# Patient Record
Sex: Female | Born: 1980 | Race: White | Hispanic: No | Marital: Married | Smoking: Current every day smoker
Health system: Southern US, Community
[De-identification: ages and names within clinical notes are randomized; demographics above are authoritative.]

---

## 2000-10-04 HISTORY — PX: TUBAL LIGATION: SHX77

## 2012-05-11 ENCOUNTER — Ambulatory Visit (INDEPENDENT_AMBULATORY_CARE_PROVIDER_SITE_OTHER): Payer: 59 | Admitting: Psychology

## 2012-05-11 DIAGNOSIS — F331 Major depressive disorder, recurrent, moderate: Secondary | ICD-10-CM

## 2012-05-24 ENCOUNTER — Ambulatory Visit (INDEPENDENT_AMBULATORY_CARE_PROVIDER_SITE_OTHER): Payer: 59 | Admitting: Psychology

## 2012-05-24 DIAGNOSIS — F331 Major depressive disorder, recurrent, moderate: Secondary | ICD-10-CM

## 2012-06-07 ENCOUNTER — Ambulatory Visit (INDEPENDENT_AMBULATORY_CARE_PROVIDER_SITE_OTHER): Payer: 59 | Admitting: Psychology

## 2012-06-07 DIAGNOSIS — F331 Major depressive disorder, recurrent, moderate: Secondary | ICD-10-CM

## 2012-06-21 ENCOUNTER — Ambulatory Visit: Payer: 59 | Admitting: Psychology

## 2015-04-17 ENCOUNTER — Other Ambulatory Visit (HOSPITAL_COMMUNITY): Payer: Self-pay | Admitting: Orthopaedic Surgery

## 2015-04-17 DIAGNOSIS — M25561 Pain in right knee: Secondary | ICD-10-CM

## 2015-04-28 ENCOUNTER — Ambulatory Visit (HOSPITAL_COMMUNITY)
Admission: RE | Admit: 2015-04-28 | Discharge: 2015-04-28 | Disposition: A | Discharge status: Home / Self Care | Payer: 59 | Source: Ambulatory Visit | Attending: Orthopaedic Surgery | Admitting: Orthopaedic Surgery

## 2015-04-28 DIAGNOSIS — M659 Synovitis and tenosynovitis, unspecified: Secondary | ICD-10-CM | POA: Diagnosis not present

## 2015-04-28 DIAGNOSIS — M25561 Pain in right knee: Secondary | ICD-10-CM | POA: Diagnosis present

## 2015-04-28 DIAGNOSIS — M25461 Effusion, right knee: Secondary | ICD-10-CM | POA: Diagnosis not present

## 2016-12-16 IMAGING — MR MR KNEE*R* W/O CM
4 of 6 series · 19 of 40 positions shown · non-contrast
Comparison: None.

CLINICAL DATA: Right knee effusions. Right knee pain. Injury 6
months ago.

EXAM:
MRI OF THE RIGHT KNEE WITHOUT CONTRAST
TECHNIQUE: Multiplanar, multisequence MR imaging of the knee was performed. No
intravenous contrast was administered.

[Series 4: PD fat-sat · axial · 4.0mm · 0.27mm/px · z∈[-12,+103]mm · 7 of 24 slices shown (1 of 4)]
[im 1/24]
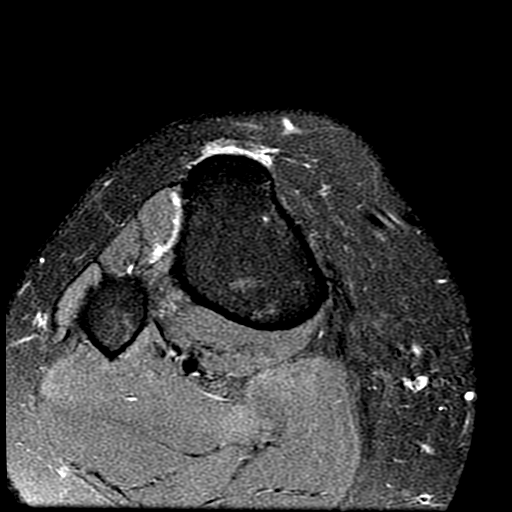
[im 4/24]
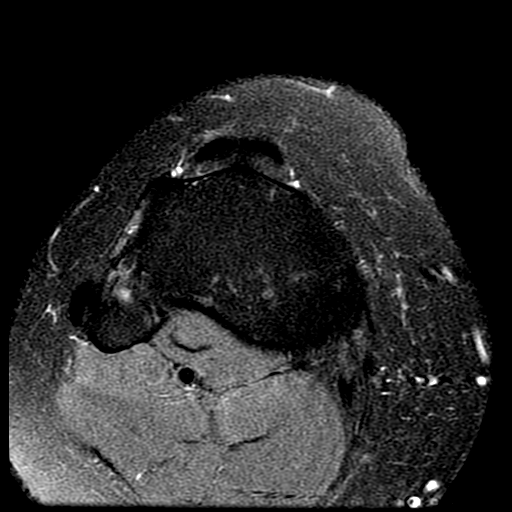
[im 8/24]
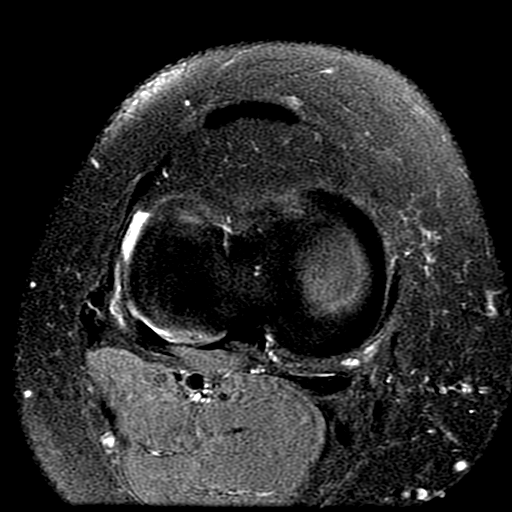
[im 12/24]
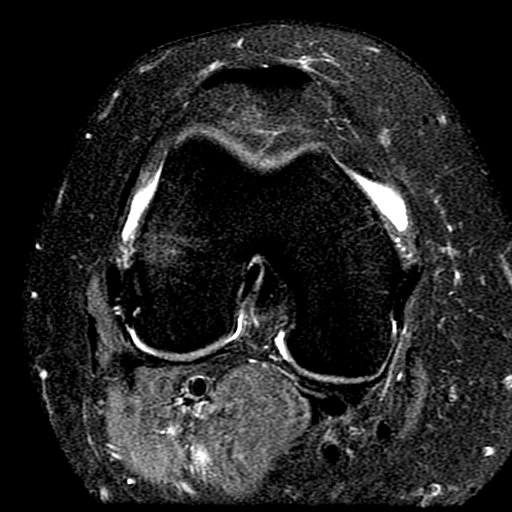
[im 16/24]
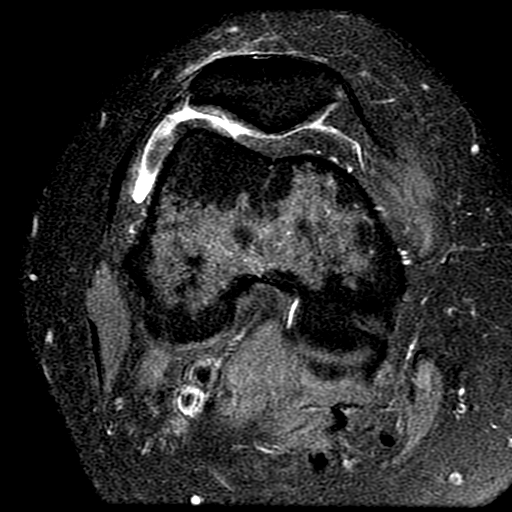
[im 20/24]
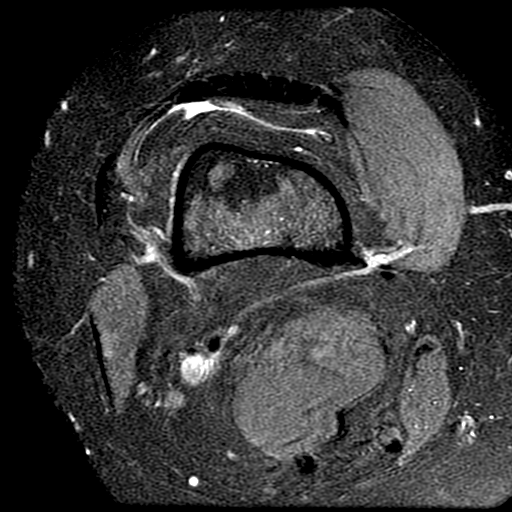
[im 24/24]
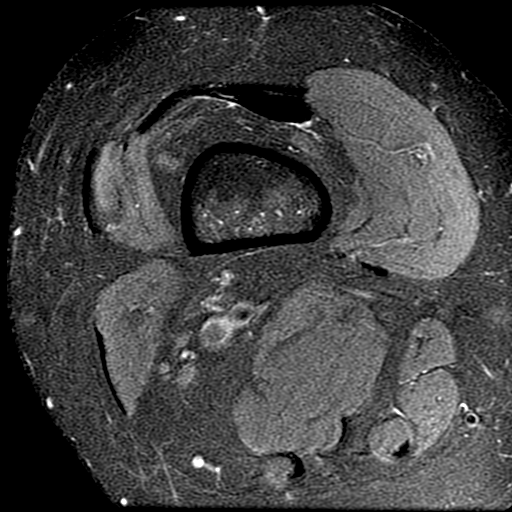

[Series 6: PD fat-sat · coronal · 4.0mm · 0.31mm/px · 6 of 24 slices shown (2 of 4)]
[im 1/24]
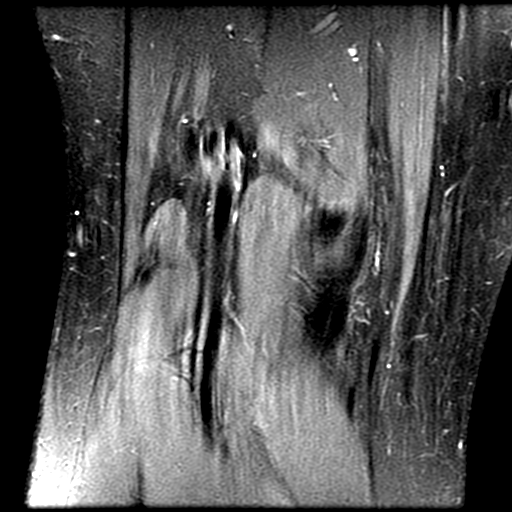
[im 4/24]
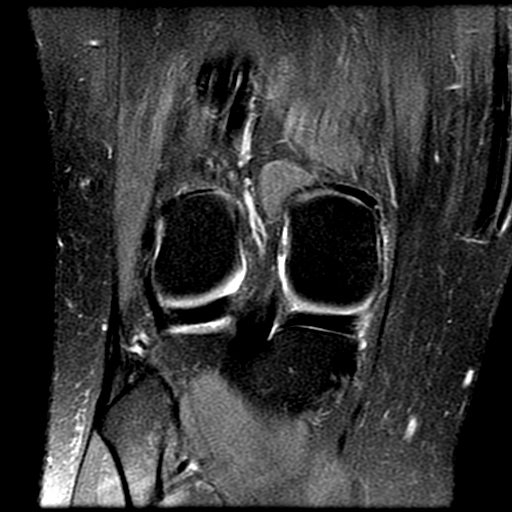
[im 8/24]
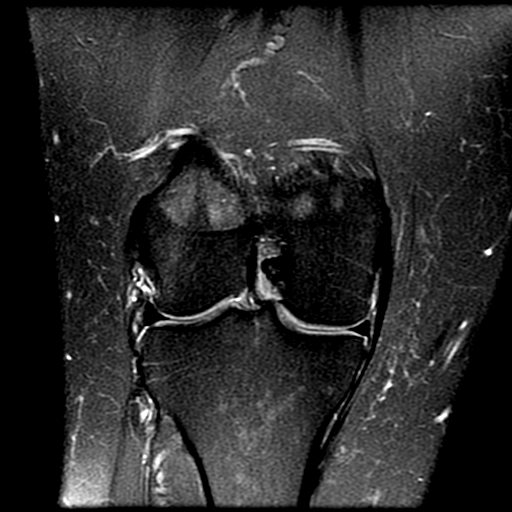
[im 12/24]
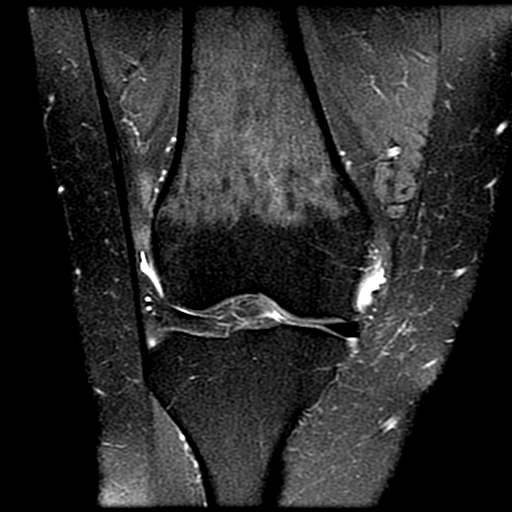
[im 16/24]
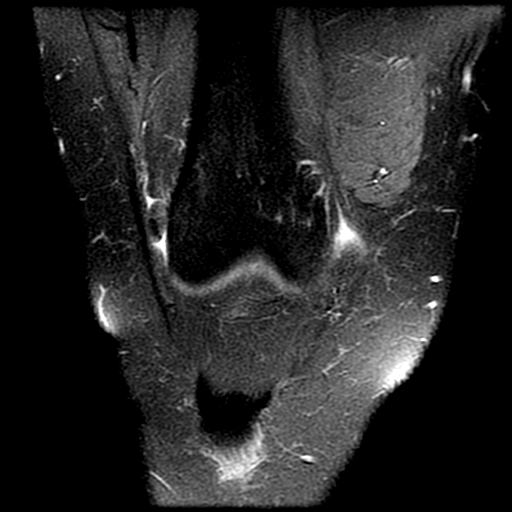
[im 20/24]
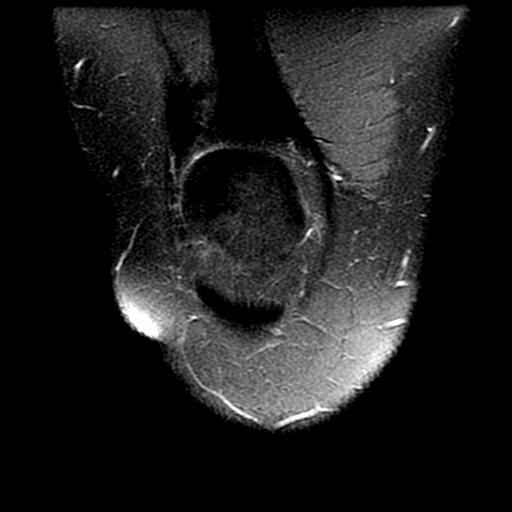

[Series 7: PD fat-sat · sagittal · 4.0mm · 0.31mm/px · 3 of 24 slices shown (3 of 4)]
[im 4/24]
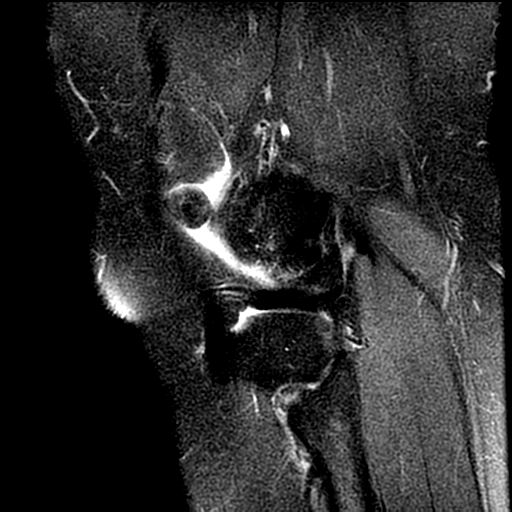
[im 12/24]
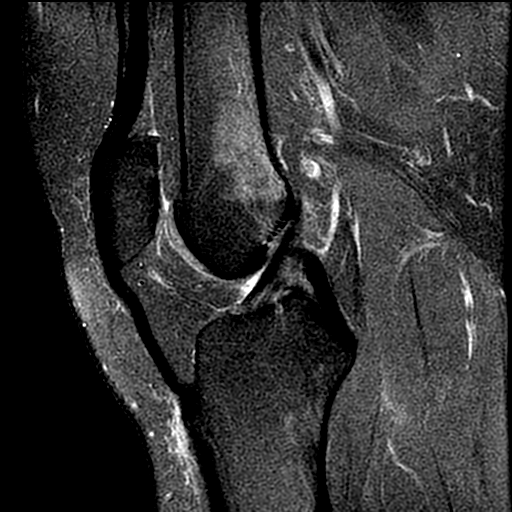
[im 20/24]
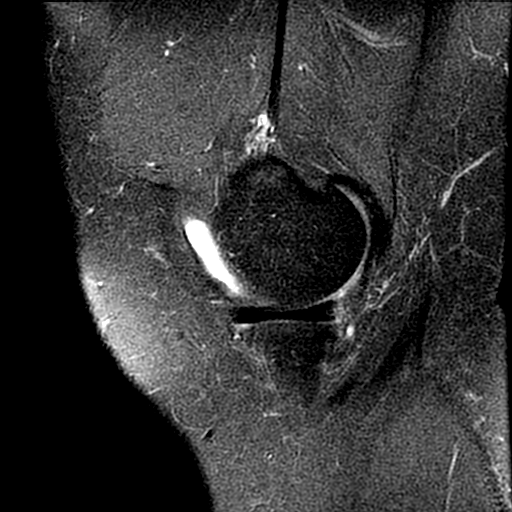

[Series 9: PD fat-sat · coronal · 2.0mm · 0.31mm/px · 3 of 16 slices shown (4 of 4)]
[im 1/16]
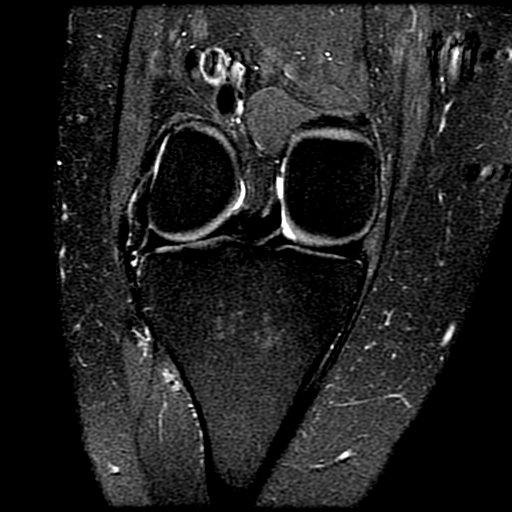
[im 8/16]
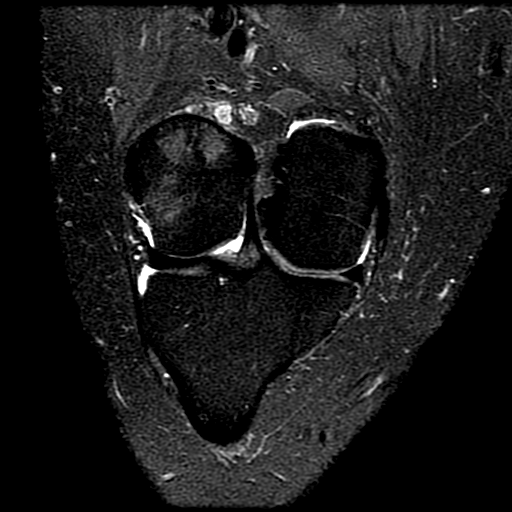
[im 16/16]
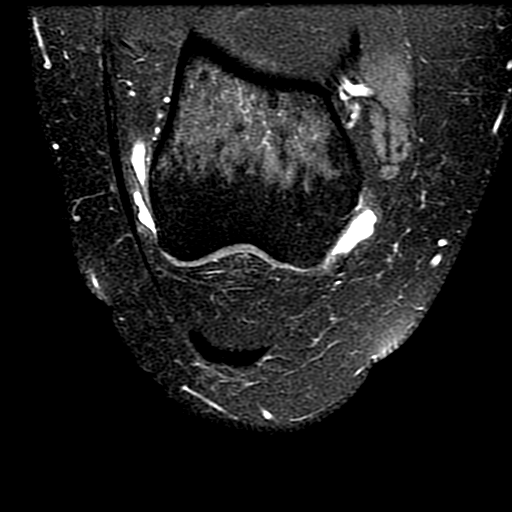

[19 of 40 positions shown; findings below may reference images not displayed]

FINDINGS: MENISCI

Medial meniscus:  Unremarkable

Lateral meniscus:  Unremarkable

LIGAMENTS

Cruciates:  Unremarkable

Collaterals:  Unremarkable

CARTILAGE

Patellofemoral: Moderate to severe degenerative chondral thinning
along portions of the posterior patellar ridge and adjacent medial
and lateral facets. Mild chondral fissuring along the lateral
patellar facet. Moderate chondral thinning in the femoral trochlear
groove. Mild marginal spurring.

Medial: Mild to moderate degenerative chondral thinning. Mild
marginal spurring.

Lateral: Mild degenerative chondral thinning. Mild marginal
spurring.

Joint: Trace knee effusion with mild synovitis lateral to the
patellofemoral joint as shown on images 14 through 17 of series 8.

Popliteal Fossa:  Unremarkable

Extensor Mechanism:  Unremarkable

Bones: Minimal marrow edema laterally in the lateral femoral condyle
(image 9, series 8).
IMPRESSION: 1. Trace knee effusion with low-level synovitis below the lateral
patellar retinaculum.
2. Chondral thinning most striking in the patellofemoral joint.
3. The minimal marrow edema laterally in the lateral femoral
condyle. This may be reactive from the adjacent synovitis.
4. Overall I am doubtful of iliotibial band syndrome although some
of the findings in this case could also be seen in iliotibial band
syndrome.

## 2018-01-30 ENCOUNTER — Ambulatory Visit (INDEPENDENT_AMBULATORY_CARE_PROVIDER_SITE_OTHER): Payer: 59

## 2018-01-30 ENCOUNTER — Ambulatory Visit (INDEPENDENT_AMBULATORY_CARE_PROVIDER_SITE_OTHER): Payer: 59 | Admitting: Podiatry

## 2018-01-30 DIAGNOSIS — M2141 Flat foot [pes planus] (acquired), right foot: Secondary | ICD-10-CM

## 2018-01-30 DIAGNOSIS — M779 Enthesopathy, unspecified: Secondary | ICD-10-CM

## 2018-01-30 DIAGNOSIS — M7750 Other enthesopathy of unspecified foot: Secondary | ICD-10-CM

## 2018-01-30 DIAGNOSIS — M7751 Other enthesopathy of right foot: Secondary | ICD-10-CM | POA: Diagnosis not present

## 2018-01-30 DIAGNOSIS — M2142 Flat foot [pes planus] (acquired), left foot: Secondary | ICD-10-CM | POA: Diagnosis not present

## 2018-01-30 DIAGNOSIS — M778 Other enthesopathies, not elsewhere classified: Secondary | ICD-10-CM

## 2018-01-30 NOTE — Progress Notes (Signed)
  Subjective:  Patient ID: Alexandra Ortega, female    DOB: 04/19/1981,  MRN: [id number]  Chief Complaint  Patient presents with  . Numbness    R 2nd-3rd toes numbness and tingling x 1 week -not diabetic Tx: none  . Flat Foot    -interested new set of orthotics Pt. stated," I used to wear orthotics when I was a little kid." Tx: good supportive shoes    37 y.o. female presents with the above complaint.  Complains of right second third toe numbness and tingling x1 week.  States that it started after she had a new pair of shoes that she believes from the top of her foot.  Reports pain in this area as well.  Reports a history of flatfeet and wishes to discuss orthotics.  She states she is to wear orthotics and she was a kid.   No past medical history on file. No current outpatient medications on file.  Not on File Review of Systems: Negative except as noted in the HPI. Denies N/V/F/Ch. Objective:  There were no vitals filed for this visit. General AA&O x3. Normal mood and affect.  Vascular Dorsalis pedis and posterior tibial pulses  present 2+ bilaterally  Capillary refill normal to all digits. Pedal hair growth normal.  Neurologic Epicritic sensation grossly present.  Dermatologic No open lesions. Interspaces clear of maceration. Nails well groomed and normal in appearance.  Orthopedic: MMT 5/5 in dorsiflexion, plantarflexion, inversion, and eversion. Normal joint ROM without pain or crepitus. Pain palpation about the dorsal midfoot right.   Assessment & Plan:  Patient was evaluated and treated and all questions answered.  Capsulitis/Tendonitis R -X-rays taken reviewed pes planus deformity.  No acute fractures or dislocations. -Discussed patient at the numbness and tingling in the toes is most likely from irritation over the dorsal midfoot.  Injection delivered to the dorsal midfoot.  Procedure: Joint Injection Location: Right dorsal TMT joint Skin Prep: Alcohol. Injectate: 0.5 cc  1% lidocaine plain, 0.5 cc dexamethasone phosphate. Disposition: Patient tolerated procedure well. Injection site dressed with a band-aid.   Pes Planus -We will fabricate custom orthotics for patient. Patient understands they are not covered by her insurance.  Return in about 1 month (around 02/27/2018) for Capsulitis, Tendonitis.

## 2018-01-31 ENCOUNTER — Other Ambulatory Visit: Payer: Self-pay | Admitting: Podiatry

## 2018-01-31 DIAGNOSIS — M2141 Flat foot [pes planus] (acquired), right foot: Secondary | ICD-10-CM

## 2018-01-31 DIAGNOSIS — M779 Enthesopathy, unspecified: Secondary | ICD-10-CM

## 2018-01-31 DIAGNOSIS — M778 Other enthesopathies, not elsewhere classified: Secondary | ICD-10-CM

## 2018-01-31 DIAGNOSIS — M2142 Flat foot [pes planus] (acquired), left foot: Principal | ICD-10-CM

## 2018-03-23 ENCOUNTER — Ambulatory Visit: Payer: 59 | Admitting: *Deleted

## 2018-03-23 DIAGNOSIS — M2141 Flat foot [pes planus] (acquired), right foot: Secondary | ICD-10-CM

## 2018-03-23 DIAGNOSIS — M778 Other enthesopathies, not elsewhere classified: Secondary | ICD-10-CM

## 2018-03-23 DIAGNOSIS — M2142 Flat foot [pes planus] (acquired), left foot: Principal | ICD-10-CM

## 2018-03-23 DIAGNOSIS — M779 Enthesopathy, unspecified: Secondary | ICD-10-CM

## 2018-03-23 NOTE — Patient Instructions (Signed)

## 2018-03-23 NOTE — Progress Notes (Signed)
Patient ID: Alexandra Ortega, female   DOB: 10/07/1980, 37 y.o.   MRN: [id number]   Patient presents for orthotic pick up.  Verbal and written break in and wear instructions given.  Patient will follow up with Dr. Samuella CotaPrice in 4-6 weeks if symptoms worsen or fail to improve.

## 2020-10-04 HISTORY — PX: HERNIA REPAIR: SHX51

## 2020-10-04 HISTORY — PX: OTHER SURGICAL HISTORY: SHX169

## 2022-12-06 ENCOUNTER — Encounter: Payer: Self-pay | Admitting: Internal Medicine

## 2022-12-06 ENCOUNTER — Ambulatory Visit: Payer: BC Managed Care – PPO | Admitting: Internal Medicine

## 2022-12-06 VITALS — BP 136/78 | HR 106 | Temp 97.9°F | Resp 17 | Ht 66.5 in | Wt 226.2 lb

## 2022-12-06 DIAGNOSIS — Z889 Allergy status to unspecified drugs, medicaments and biological substances status: Secondary | ICD-10-CM

## 2022-12-06 DIAGNOSIS — J3089 Other allergic rhinitis: Secondary | ICD-10-CM | POA: Diagnosis not present

## 2022-12-06 DIAGNOSIS — J302 Other seasonal allergic rhinitis: Secondary | ICD-10-CM

## 2022-12-06 DIAGNOSIS — J310 Chronic rhinitis: Secondary | ICD-10-CM

## 2022-12-06 MED ORDER — METHYLPREDNISOLONE ACETATE 40 MG/ML IJ SUSP
40.0000 mg | Freq: Once | INTRAMUSCULAR | Status: AC
  Administered 2022-12-06: 40 mg via INTRAMUSCULAR

## 2022-12-06 MED ORDER — LEVOCETIRIZINE DIHYDROCHLORIDE 5 MG PO TABS: 5.0000 mg | ORAL_TABLET | Freq: Every evening | ORAL | 5 refills | Status: DC

## 2022-12-06 MED ORDER — AZELASTINE HCL 0.1 % NA SOLN: 2.0000 | Freq: Two times a day (BID) | NASAL | 12 refills | Status: DC

## 2022-12-06 MED ORDER — EPINEPHRINE 0.3 MG/0.3ML IJ SOAJ: 0.3000 mg | INTRAMUSCULAR | 1 refills | Status: AC | PRN

## 2022-12-06 MED ORDER — MONTELUKAST SODIUM 10 MG PO TABS: 10.0000 mg | ORAL_TABLET | Freq: Every day | ORAL | 5 refills | Status: DC

## 2022-12-06 NOTE — Progress Notes (Unsigned)
New Patient Note  RE: Alexandra Ortega MRN: [id number] DOB: 1981-03-22 Date of Office Visit: 12/06/2022  Consult requested by: Haydee Salter, NP Primary care provider: Haydee Salter, NP  Chief Complaint: No chief complaint on file.  History of Present Illness: I had the pleasure of seeing Alexandra Ortega for initial evaluation at the [facility] on 12/06/2022. She is a 42 y.o. female, who is referred here by Haydee Salter, NP for the evaluation of animal allergy.  History obtained from patient, chart review.  Chronic rhinitis: started OCT 2023, worsened since getting 3rd female dog in [facility] 2024.  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 She has been referred to ENT  Nonallergic triggers:  denies      Assessment and Plan: Ilima is a 42 y.o. female with: No diagnosis found.  *** Plan: Patient Instructions  Chronic Rhinitis {Blank single:19197::"Seasonal and Perennial Allergic","Seasonal Allergic","Perennial Allergic","Nonallergic"}: - allergy testing today: ***  - Prevention:  - allergen avoidance when possible - consider allergy shots as long term control of your symptoms by teaching your immune system to be more tolerant of your allergy triggers  - Symptom control: - {Blank single:19197::"Continue","Start","Increase","Consider"} Nasal Steroid Spray: Best results if used daily. -  Options include Flonase (fluticasone), Nasocort (triamcinolone), Nasonex (mometasome) 1- 2 sprays in each nostril daily.  - All can be purchased over-the-counter if not covered by insurance. - {Blank single:19197::"Continue","Start","Increase","Consider"} {Blank single:19197::"Astelin (azelastine)","Ryaltris"} 1-2 sprays in each nostril twice a day as needed for nasal congestion/itchy nose - {Blank single:19197::"Continue","Start","Increase","Consider"} Atrovent (Ipratropium Bromide) 1-2 sprays in each nostril up to 3 times a day as needed for runny nose/post nasal drip/drainage.   - Use less frequently if airway gets too dry. - {Blank single:19197::"Continue","Start","Increase","Consider"} Singulair (Montelukast) '10mg'$  nightly.   - Discontinue if nightmares of behavior changes. - {Blank single:19197::"Continue","Start","Increase,","Consider"} Antihistamine: daily or daily as needed.   -Options include Zyrtec (Cetirizine) '10mg'$ , Claritin (Loratadine) '10mg'$ , Allegra (Fexofenadine) '180mg'$ , or Xyzal (Levocetirinze) '5mg'$  - Can be purchased over-the-counter if not covered by insurance.  {Blank single:19197::"Allergic","Nonallergic"} Conjunctivitis:  - {Blank single:19197::"Continue","Start","Increase","Consider"} Allergy Eye drops-great options include Pataday (Olopatadine) or Zaditor (ketotifen) for eye symptoms daily as needed-both sold over the counter if not covered by insurance. and Rewetting Drops such as Systane,TheraTears, etc  -Avoid eye drops that say red eye relief as they may contain medications that dry out your eyes.   Follow up:   Thank you so much for letting me partake in your care today.  Don't hesitate to reach out if you have any additional concerns!  Roney Marion, MD  [facility] [place] [place]  Control of Dog or Cat Allergen  Avoidance is the best way to manage a dog or cat allergy. If you have a dog or cat and are allergic to dog or cats, consider removing the  dog or cat from the home. If you have a dog or cat but don't want to find it a new  home, or if your family wants a pet even though someone in the household is allergic, here are some strategies that may help keep symptoms at bay:  Keep the pet out of your bedroom and restrict it to only a few rooms. Be advised that keeping the dog or cat in only one room will not limit the allergens to that room. Don't pet, hug or kiss the dog or cat; if you do, wash your hands with soap and water. High-efficiency particulate air (HEPA) cleaners run continuously in a bedroom or living room can reduce allergen levels over time. Regular use of a high-efficiency vacuum cleaner or a central vacuum can reduce allergen levels. Giving your dog or cat a bath at least once a week can reduce airborne allergen.  DUST MITE AVOIDANCE MEASURES:  There are three main measures that need and can be taken to avoid house dust mites:  Reduce accumulation of dust in general -reduce furniture, clothing, carpeting, books, stuffed animals, especially in bedroom  Separate yourself from the dust -use pillow and mattress encasements (can be found at stores such as [employer], [employer] or online) -avoid direct exposure to air condition flow -use a HEPA filter device, especially in the bedroom; you can also use a HEPA filter vacuum cleaner -wipe dust with a moist towel instead of a dry towel or broom when cleaning  Decrease mites and/or their secretions -wash clothing and linen and stuffed animals at highest temperature possible, at least every 2 weeks -stuffed animals can also be placed in a bag and put in a freezer overnight  Despite the above measures, it is impossible to eliminate dust mites or their allergen completely from your home.  With the above measures the burden of mites in your home can be diminished, with the goal of minimizing your allergic symptoms.  Success will be reached only when implementing and using all means  together.  Reducing Pollen Exposure  The American Academy of Allergy, Asthma and Immunology suggests the following steps to reduce your exposure to pollen during allergy seasons.    Do not hang sheets or clothing out to dry; pollen may collect on these items. Do not mow lawns or spend time around freshly cut grass; mowing stirs up pollen. Keep windows closed at night.  Keep car windows closed while driving. Minimize morning activities outdoors, a time when pollen counts are usually at their highest. Stay indoors as much as possible when pollen counts or humidity is high and on windy days when pollen tends to remain in the air longer. Use air conditioning when possible.  Many air conditioners have filters that trap the pollen spores. Use a HEPA room air filter to remove pollen form the indoor air you breathe.    No orders of the defined types were placed in this encounter.  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  Epicutaneous Testing: Dust mite, dog, cat  Intradermal Testing: ***  adequate controls  Results interpreted by myself and discussed with patient/family.   Past Medical History: There are no problems to display for this patient.  No past medical history on file. Past Surgical History: ***  The histories are not reviewed yet. Please review them in the "History" navigator section and refresh this [place]. Medication List:  No current outpatient medications on file.   No current facility-administered medications for this visit.   Allergies: Not on File Social  History: Social History   Socioeconomic History   Marital status: Married    Spouse name: Not on file   Number of children: Not on file   Years of education: Not on file   Highest education level: Not on file  Occupational History   Not on file  Tobacco Use   Smoking status: Not on file   Smokeless tobacco: Not on file  Substance and Sexual Activity   Alcohol use: Not on file   Drug use: Not on file   Sexual activity: Not on file  Other Topics Concern   Not on file  Social History Narrative   Not on file   Social Determinants of Health   Financial Resource Strain: Not on file  Food Insecurity: Not on file  Transportation Needs: Not on file  Physical Activity: Not on file  Stress: Not on file  Social Connections: Not on file   Lives in a ***. Smoking: *** Occupation: ***  Environmental HistoryFreight forwarder in the house: Estate agent in the family room: {Blank single:19197::"yes","no"} Carpet in the bedroom: {Blank single:19197::"yes","no"} Heating: {Blank single:19197::"electric","gas","heat pump"} Cooling: {Blank single:19197::"central","window","heat pump"} Pet: {Blank single:19197::"yes ***","no"}  Family History: No family history on file.   ROS: All others negative except as noted per HPI.   Objective: There were no vitals taken for this visit. There is no height or weight on file to calculate BMI.  General Appearance:  Alert, cooperative, no distress, appears stated age  Head:  Normocephalic, without obvious abnormality, atraumatic  Eyes:  Conjunctiva clear, EOM's intact  Nose: Nares normal,  very edematous nasal mucosa with clear rhinorrhea, 90% obstruction on bilateral sides, hypertrophic turbinates, no visible anterior polyps, and septum midline  Throat: Lips, tongue normal; teeth and gums normal, no tonsillar exudate and + cobblestoning  Neck: Supple, symmetrical  Lungs:   clear to auscultation bilaterally,  Respirations unlabored, no coughing  Heart:  regular rate and rhythm and no murmur, Appears well perfused  Extremities: No edema  Skin: Skin color, texture, turgor normal, no rashes or lesions on visualized portions of skin  Neurologic: No gross deficits   The plan was reviewed with the patient/family, and all questions/concerned were addressed.  It was my pleasure to see Conesha today and participate in her care. Please feel free to contact me with any questions or concerns.  Sincerely,  Roney Marion, MD Allergy & Immunology  [facility] office: [phone] [facility] office: [phone]

## 2022-12-06 NOTE — Patient Instructions (Addendum)
Chronic Rhinitis Seasonal and Perennial Allergic: not well controlled  - allergy testing today: Dust mite, dog, cat, [place] grass, johnson grass, ragweed and weed   - Prevention:  - allergen avoidance when possible - Start Allergy injections: information for standard and RUSH build up given today.  Consent signed and epipen prescribed  -Stop Afrin as this is contributing to your symptoms  -40 mg of Depo-Medrol IM provided for rhinitis medic and to assess  - Symptom control: - Start Nasal Steroid Spray: Best results if used daily. - Options include Flonase (fluticasone), Nasocort (triamcinolone), Nasonex (mometasome) 1- 2 sprays in each nostril daily.  - All can be purchased over-the-counter if not covered by insurance. - Start Astelin (azelastine) 1-2 sprays in each nostril twice a day as needed for nasal congestion/itchy nose  - Start Singulair (Montelukast) '10mg'$  nightly.   - Discontinue if nightmares of behavior changes. - Continue Antihistamine: daily or daily as needed.   -Options include Zyrtec (Cetirizine) '10mg'$ , Claritin (Loratadine) '10mg'$ , Allegra (Fexofenadine) '180mg'$ , or Xyzal (Levocetirinze) '5mg'$  - Can be purchased over-the-counter if not covered by insurance.   Follow up: in clinic in 6 months  Follow up: for RUSH AIT procedure   Thank you so much for letting me partake in your care today.  Don't hesitate to reach out if you have any additional concerns!  Roney Marion, MD  [facility] [place], [place]  Control of Dog or Cat Allergen  Avoidance is the best way to manage a dog or cat allergy. If you have a dog or cat and are allergic to dog or cats, consider removing the dog or cat from the home. If you have a dog or cat but don't want to find it a new home, or if your family wants a pet even though someone in the household is allergic, here are some strategies that may help keep symptoms at bay:  Keep the pet out of your bedroom and restrict it to only a  few rooms. Be advised that keeping the dog or cat in only one room will not limit the allergens to that room. Don't pet, hug or kiss the dog or cat; if you do, wash your hands with soap and water. High-efficiency particulate air (HEPA) cleaners run continuously in a bedroom or living room can reduce allergen levels over time. Regular use of a high-efficiency vacuum cleaner or a central vacuum can reduce allergen levels. Giving your dog or cat a bath at least once a week can reduce airborne allergen.  DUST MITE AVOIDANCE MEASURES:  There are three main measures that need and can be taken to avoid house dust mites:  Reduce accumulation of dust in general -reduce furniture, clothing, carpeting, books, stuffed animals, especially in bedroom  Separate yourself from the dust -use pillow and mattress encasements (can be found at stores such as Bed, [employer], [employer] or online) -avoid direct exposure to air condition flow -use a HEPA filter device, especially in the bedroom; you can also use a HEPA filter vacuum cleaner -wipe dust with a moist towel instead of a dry towel or broom when cleaning  Decrease mites and/or their secretions -wash clothing and linen and stuffed animals at highest temperature possible, at least every 2 weeks -stuffed animals can also be placed in a bag and put in a freezer overnight  Despite the above measures, it is impossible to eliminate dust mites or their allergen completely from your home.  With the above measures the burden of  mites in your home can be diminished, with the goal of minimizing your allergic symptoms.  Success will be reached only when implementing and using all means together.  Reducing Pollen Exposure  The American Academy of Allergy, Asthma and Immunology suggests the following steps to reduce your exposure to pollen during allergy seasons.    Do not hang sheets or clothing out to dry; pollen may collect on these items. Do not mow lawns or spend  time around freshly cut grass; mowing stirs up pollen. Keep windows closed at night.  Keep car windows closed while driving. Minimize morning activities outdoors, a time when pollen counts are usually at their highest. Stay indoors as much as possible when pollen counts or humidity is high and on windy days when pollen tends to remain in the air longer. Use air conditioning when possible.  Many air conditioners have filters that trap the pollen spores. Use a HEPA room air filter to remove pollen form the indoor air you breathe.

## 2022-12-07 DIAGNOSIS — J310 Chronic rhinitis: Secondary | ICD-10-CM | POA: Insufficient documentation

## 2022-12-08 DIAGNOSIS — J3089 Other allergic rhinitis: Secondary | ICD-10-CM | POA: Diagnosis not present

## 2022-12-08 NOTE — Progress Notes (Signed)
VIALS EXP 12-08-23

## 2022-12-08 NOTE — Progress Notes (Signed)
Aeroallergen Immunotherapy   Ordering Provider: Dr. Roney Marion   Patient Details  Name: Alexandra Ortega  MRN: [id number]  Date of Birth: [birth year]   Order 1 of 1   Vial Label: [id number]   0.3 ml (Volume)  BAU Concentration -- [place] 10,000  0.2 ml (Volume)  1:20 Concentration -- Johnson  0.3 ml (Volume)  1:20 Concentration -- Ragweed Mix  0.5 ml (Volume)  1:20 Concentration -- Weed Mix*  0.5 ml (Volume)  1:10 Concentration -- Cat Hair  0.5 ml (Volume)  1:10 Concentration -- Dog Epithelia  0.5 ml (Volume)   AU Concentration -- Mite Mix (DF 5,000 & DP 5,000)    2.8  ml Extract Subtotal  2.2  ml Diluent  5.0  ml Maintenance Total   Schedule:  RUSH  Silver Vial (1:1,000,000): RUSH  Blue Vial (1:100,000): RUSH  Yellow Vial (1:10,000): RUSH  Green Vial (1:1,000): Schedule B (6 doses)  Red Vial (1:100): Schedule A (10 doses)   Special Instructions: RUSH

## 2022-12-22 MED ORDER — PREDNISONE 20 MG PO TABS: ORAL_TABLET | ORAL | 0 refills | Status: DC

## 2022-12-22 MED ORDER — FAMOTIDINE 20 MG PO TABS: ORAL_TABLET | ORAL | 0 refills | Status: DC

## 2022-12-24 ENCOUNTER — Other Ambulatory Visit: Payer: Self-pay | Admitting: Internal Medicine

## 2022-12-28 ENCOUNTER — Ambulatory Visit: Payer: BC Managed Care – PPO | Admitting: Internal Medicine

## 2022-12-28 VITALS — BP 126/78 | HR 92 | Temp 98.0°F | Resp 18

## 2022-12-28 DIAGNOSIS — J302 Other seasonal allergic rhinitis: Secondary | ICD-10-CM | POA: Diagnosis not present

## 2022-12-28 DIAGNOSIS — J3089 Other allergic rhinitis: Secondary | ICD-10-CM

## 2022-12-28 DIAGNOSIS — J309 Allergic rhinitis, unspecified: Secondary | ICD-10-CM

## 2022-12-31 NOTE — Progress Notes (Signed)
RAPID DESENSITIZATION Note  RE: Alexandra Ortega MRN: [id number] DOB: August 16, 1981 Date of Office Visit: 12/28/2022  Subjective:  Patient presents today for rapid desensitization.  Interval History: Patient has not been ill, she has taken all premedications as per protocol.  Recent/Current History: Pulmonary disease: no Cardiac disease: no Respiratory infection: no Rash: no Itch: no Swelling: no Cough: no Shortness of breath: no Runny/stuffy nose: no Itchy eyes: no Beta-blocker use: no  Patient/guardian was informed of the procedure with verbalized understanding of the risk of anaphylaxis. Consent has been signed.   Medication List:  Current Outpatient Medications  Medication Sig Dispense Refill   azelastine (ASTELIN) 0.1 % nasal spray Place 2 sprays into both nostrils 2 (two) times daily. Use in each nostril as directed 30 mL 12   Cholecalciferol (VITAMIN D-1000 MAX ST) 25 MCG (1000 UT) tablet Take by mouth.     Cholecalciferol (VITAMIN D3) 1.25 MG (50000 UT) capsule      EPINEPHrine 0.3 mg/0.3 mL IJ SOAJ injection Inject 0.3 mg into the muscle as needed for anaphylaxis. 1 each 1   famotidine (PEPCID) 20 MG tablet TAKE 1 TABLET BY MOUTH MONDAY MORNING AND 1 TABLET IN THE EVENING. TAKE 1 TABLET TUESAY MORNING BEFORE RUSH APPOINTMENT 4 tablet 0   ipratropium (ATROVENT) 0.03 % nasal spray Place 2 sprays into both nostrils 4 (four) times daily.     levocetirizine (XYZAL) 5 MG tablet Take 1 tablet (5 mg total) by mouth every evening. 32 tablet 5   loratadine (CLARITIN) 10 MG tablet Take by mouth.     montelukast (SINGULAIR) 10 MG tablet Take 1 tablet (10 mg total) by mouth at bedtime. 30 tablet 5   predniSONE (DELTASONE) 20 MG tablet Take 2 tabs Monday morning and 2 tabs Tuesday morning before RUSH appt. 4 tablet 0   traZODone (DESYREL) 100 MG tablet TAKE 1 TABLET BY MOUTH EVERY DAY AT BEDTIME for 30     venlafaxine XR (EFFEXOR-XR) 75 MG 24 hr capsule TAKE 1 CAPSULE BY MOUTH EVERY  DAY WITH FOOD     Vitamin D, Ergocalciferol, (DRISDOL) 1.25 MG (50000 UNIT) CAPS capsule 1 capsule Orally once a week for 90 days     No current facility-administered medications for this visit.   Allergies: Allergies  Allergen Reactions   Cat Hair Extract    I reviewed her past medical history, social history, family history, and environmental history and no significant changes have been reported from her previous visit.  ROS: Negative except as per HPI.  Objective: BP 126/78   Pulse 92   Temp 98 F (36.7 C) (Temporal)   Resp 18   SpO2 97%  There is no height or weight on file to calculate BMI.   General Appearance:  Alert, cooperative, no distress, appears stated age  Head:  Normocephalic, without obvious abnormality, atraumatic  Eyes:  Conjunctiva clear, EOM's intact  Nose: Nares normal  Throat: Lips, tongue normal; teeth and gums normal, normal posterior oropharnyx  Neck: Supple, symmetrical  Lungs:   CTAB, Respirations unlabored, no coughing  Heart:  Appears well perfused  Extremities: No edema  Skin: Skin color, texture, turgor normal, no rashes or lesions on visualized portions of skin  Neurologic: No gross deficits     Diagnostics: None done   PROCEDURES:  Patient received the following doses every hour: Step 1:  0.25ml - 1:1,000,000 dilution (silver vial) Step 2:  0.69ml - 1:1,000,000 dilution (silver vial) Step 3: 0.12ml - 1:100,000 dilution (blue vial)  Step 4: 0.30ml - 1:100,000 dilution (blue vial)  Step 5: 0.62ml - 1:10,000 dilution (gold vial) Step 6: 0.68ml - 1:10,000 dilution (gold vial) Step 7: 0.27ml - 1:10,000 dilution (gold vial) Step 8: 0.84ml - 1:10,000 dilution (gold vial)  Patient was observed for 1 hour after the last dose.   Procedure started at 8:45 Procedure ended at 2:45   ASSESSMENT/PLAN:   Patient has tolerated the rapid desensitization protocol.  Next appointment: Start at 0.57ml of 1:1000 dilution (green vial) and build up per  protocol.

## 2023-01-04 ENCOUNTER — Ambulatory Visit (INDEPENDENT_AMBULATORY_CARE_PROVIDER_SITE_OTHER): Payer: BC Managed Care – PPO

## 2023-01-04 DIAGNOSIS — J309 Allergic rhinitis, unspecified: Secondary | ICD-10-CM | POA: Diagnosis not present

## 2023-01-11 ENCOUNTER — Ambulatory Visit (INDEPENDENT_AMBULATORY_CARE_PROVIDER_SITE_OTHER): Payer: BC Managed Care – PPO | Admitting: *Deleted

## 2023-01-11 DIAGNOSIS — J309 Allergic rhinitis, unspecified: Secondary | ICD-10-CM | POA: Diagnosis not present

## 2023-01-18 ENCOUNTER — Ambulatory Visit (INDEPENDENT_AMBULATORY_CARE_PROVIDER_SITE_OTHER): Payer: BC Managed Care – PPO | Admitting: *Deleted

## 2023-01-18 DIAGNOSIS — J309 Allergic rhinitis, unspecified: Secondary | ICD-10-CM

## 2023-01-26 ENCOUNTER — Ambulatory Visit (INDEPENDENT_AMBULATORY_CARE_PROVIDER_SITE_OTHER): Payer: BC Managed Care – PPO | Admitting: *Deleted

## 2023-01-26 DIAGNOSIS — J309 Allergic rhinitis, unspecified: Secondary | ICD-10-CM

## 2023-02-01 ENCOUNTER — Ambulatory Visit (INDEPENDENT_AMBULATORY_CARE_PROVIDER_SITE_OTHER): Payer: BC Managed Care – PPO | Admitting: *Deleted

## 2023-02-01 DIAGNOSIS — J309 Allergic rhinitis, unspecified: Secondary | ICD-10-CM

## 2023-02-09 ENCOUNTER — Ambulatory Visit (INDEPENDENT_AMBULATORY_CARE_PROVIDER_SITE_OTHER): Payer: BC Managed Care – PPO | Admitting: *Deleted

## 2023-02-09 DIAGNOSIS — J309 Allergic rhinitis, unspecified: Secondary | ICD-10-CM | POA: Diagnosis not present

## 2023-02-16 ENCOUNTER — Ambulatory Visit (INDEPENDENT_AMBULATORY_CARE_PROVIDER_SITE_OTHER): Payer: BC Managed Care – PPO

## 2023-02-16 DIAGNOSIS — J309 Allergic rhinitis, unspecified: Secondary | ICD-10-CM

## 2023-02-23 ENCOUNTER — Ambulatory Visit (INDEPENDENT_AMBULATORY_CARE_PROVIDER_SITE_OTHER): Payer: BC Managed Care – PPO | Admitting: *Deleted

## 2023-02-23 DIAGNOSIS — J309 Allergic rhinitis, unspecified: Secondary | ICD-10-CM

## 2023-03-02 ENCOUNTER — Ambulatory Visit (INDEPENDENT_AMBULATORY_CARE_PROVIDER_SITE_OTHER): Payer: BC Managed Care – PPO

## 2023-03-02 DIAGNOSIS — J309 Allergic rhinitis, unspecified: Secondary | ICD-10-CM

## 2023-03-09 ENCOUNTER — Ambulatory Visit (INDEPENDENT_AMBULATORY_CARE_PROVIDER_SITE_OTHER): Payer: BC Managed Care – PPO | Admitting: *Deleted

## 2023-03-09 DIAGNOSIS — J309 Allergic rhinitis, unspecified: Secondary | ICD-10-CM | POA: Diagnosis not present

## 2023-03-17 ENCOUNTER — Ambulatory Visit (INDEPENDENT_AMBULATORY_CARE_PROVIDER_SITE_OTHER): Payer: BC Managed Care – PPO | Admitting: *Deleted

## 2023-03-17 DIAGNOSIS — J309 Allergic rhinitis, unspecified: Secondary | ICD-10-CM | POA: Diagnosis not present

## 2023-03-23 ENCOUNTER — Ambulatory Visit (INDEPENDENT_AMBULATORY_CARE_PROVIDER_SITE_OTHER): Payer: BC Managed Care – PPO | Admitting: *Deleted

## 2023-03-23 DIAGNOSIS — J309 Allergic rhinitis, unspecified: Secondary | ICD-10-CM | POA: Diagnosis not present

## 2023-04-05 ENCOUNTER — Ambulatory Visit (INDEPENDENT_AMBULATORY_CARE_PROVIDER_SITE_OTHER): Payer: BC Managed Care – PPO

## 2023-04-05 DIAGNOSIS — J309 Allergic rhinitis, unspecified: Secondary | ICD-10-CM

## 2023-04-12 DIAGNOSIS — J3089 Other allergic rhinitis: Secondary | ICD-10-CM | POA: Diagnosis not present

## 2023-04-12 NOTE — Progress Notes (Signed)
VIAL EXP 04-11-24

## 2023-04-13 ENCOUNTER — Ambulatory Visit (INDEPENDENT_AMBULATORY_CARE_PROVIDER_SITE_OTHER): Payer: BC Managed Care – PPO

## 2023-04-13 DIAGNOSIS — J309 Allergic rhinitis, unspecified: Secondary | ICD-10-CM

## 2023-04-26 ENCOUNTER — Ambulatory Visit (INDEPENDENT_AMBULATORY_CARE_PROVIDER_SITE_OTHER): Payer: BC Managed Care – PPO | Admitting: *Deleted

## 2023-04-26 DIAGNOSIS — J309 Allergic rhinitis, unspecified: Secondary | ICD-10-CM

## 2023-05-05 ENCOUNTER — Ambulatory Visit (INDEPENDENT_AMBULATORY_CARE_PROVIDER_SITE_OTHER): Payer: BC Managed Care – PPO | Admitting: *Deleted

## 2023-05-05 DIAGNOSIS — J309 Allergic rhinitis, unspecified: Secondary | ICD-10-CM | POA: Diagnosis not present

## 2023-05-18 ENCOUNTER — Ambulatory Visit (INDEPENDENT_AMBULATORY_CARE_PROVIDER_SITE_OTHER): Payer: BC Managed Care – PPO | Admitting: *Deleted

## 2023-05-18 DIAGNOSIS — J309 Allergic rhinitis, unspecified: Secondary | ICD-10-CM

## 2023-05-27 ENCOUNTER — Ambulatory Visit: Payer: BC Managed Care – PPO | Admitting: Internal Medicine

## 2023-05-27 ENCOUNTER — Encounter: Payer: Self-pay | Admitting: Internal Medicine

## 2023-05-27 VITALS — BP 110/78 | HR 94 | Resp 16

## 2023-05-27 DIAGNOSIS — J302 Other seasonal allergic rhinitis: Secondary | ICD-10-CM

## 2023-05-27 DIAGNOSIS — H1045 Other chronic allergic conjunctivitis: Secondary | ICD-10-CM | POA: Diagnosis not present

## 2023-05-27 DIAGNOSIS — J3089 Other allergic rhinitis: Secondary | ICD-10-CM | POA: Diagnosis not present

## 2023-05-27 NOTE — Progress Notes (Signed)
Follow Up Note  RE: Alexandra Ortega MRN: [id number] DOB: [birth year] Date of Office Visit: 05/27/2023  Referring provider: Rolm Gala, NP Primary care provider: Joaquin Music, NP  Chief Complaint: Allergies  History of Present Illness: I had the pleasure of seeing Alexandra Ortega for a follow up visit at the [facility] on 05/27/2023. She is a 42 y.o. female, who is being followed for allergic rhinitis on Ait . Her previous allergy office visit was on 12/28/22 with Dr. Marlynn Perking. Today is a regular follow up visit.  History obtained from patient, chart review.  Pertinent History/Diagnostics:  - Allergic Rhinitis: year round, flares with dog, mild hyposmia, recurrent sinus infections   - H/o rhinitis medicantosa r'x with IM depo 12/06/32   - SPT environmental panel (12/07/22): G, W, DM, D, C  - RUSH 12/28/22:    Reached maintenance 05/05/23   Vial 1 (DM-D-C-W-G)  Today she reports:  AIT is going well, significant improvement. In symptoms.  Continues to take  Mild local reactions. No systemic reactions.   No ABX or infections since last visit.  Needing nasal spray 1 time per week.  Continuing singulair, cetirizine daily.      Assessment and Plan: Christyana is a 42 y.o. female with: Seasonal and perennial allergic rhinitis  Other chronic allergic conjunctivitis of both eyes   Plan: Patient Instructions  Chronic Rhinitis Seasonal and Perennial Allergic:  - Control: Well controlled  - Prevention:  -  Continue allergen avoidance   - Symptom control: - Continue Nasal Steroid Spray: Best results if used daily. - Options include Flonase (fluticasone), Nasocort (triamcinolone), Nasonex (mometasome) 1- 2 sprays in each nostril daily.  - All can be purchased over-the-counter if not covered by insurance. - Continue Astelin (azelastine) 1-2 sprays in each nostril twice a day as needed for nasal congestion/itchy nose - Continue Singulair (Montelukast) 10mg  nightly.   -  Discontinue if nightmares of behavior changes. - Continue Antihistamine: daily or daily as needed.   -Options include Zyrtec (Cetirizine) 10mg , Claritin (Loratadine) 10mg , Allegra (Fexofenadine) 180mg , or Xyzal (Levocetirinze) 5mg  - Can be purchased over-the-counter if not covered by insurance.  - AIT PLAN:  Continue allergy injections per procotol and carry epipen on injection day   Allergic Conjunctivitis:  - Consider Allergy Eye drops-great options include Pataday (Olopatadine) or Zaditor (ketotifen) for eye symptoms daily as needed-both sold over the counter if not covered by insurance. and Rewetting Drops such as Systane,TheraTears, etc  -Avoid eye drops that say red eye relief as they may contain medications that dry out your eyes.   Follow up:  6 months   Thank you so much for letting me partake in your care today.  Don't hesitate to reach out if you have any additional concerns!  Ferol Luz, MD  [facility] [place] [place]    No orders of the defined types were placed in this encounter.   Lab Orders  No laboratory test(s) ordered today   Diagnostics: None    Medication List:  Current Outpatient Medications  Medication Sig Dispense Refill   azelastine (ASTELIN) 0.1 % nasal spray Place 2 sprays into both nostrils 2 (two) times daily. Use in each nostril as directed 30 mL 12   EPINEPHrine 0.3 mg/0.3 mL IJ SOAJ injection Inject 0.3 mg into the muscle as needed for anaphylaxis. 1 each 1   levocetirizine (XYZAL) 5 MG tablet Take 1 tablet (5 mg total) by mouth every evening. 32 tablet 5  loratadine (CLARITIN) 10 MG tablet Take by mouth.     montelukast (SINGULAIR) 10 MG tablet Take 1 tablet (10 mg total) by mouth at bedtime. 30 tablet 5   traZODone (DESYREL) 100 MG tablet TAKE 1 TABLET BY MOUTH EVERY DAY AT BEDTIME for 30     venlafaxine XR (EFFEXOR-XR) 37.5 MG 24 hr capsule TAKE 1 CAPSULE BY MOUTH EVERY DAY WITH FOOD Orally Once a day for 30 days      Vitamin D, Ergocalciferol, (DRISDOL) 1.25 MG (50000 UNIT) CAPS capsule 1 capsule Orally once a week for 90 days     No current facility-administered medications for this visit.   Allergies: Allergies  Allergen Reactions   Cat Hair Extract    I reviewed her past medical history, social history, family history, and environmental history and no significant changes have been reported from her previous visit.  ROS: All others negative except as noted per HPI.   Objective: BP 110/78   Pulse 94   Resp 16   SpO2 96%  There is no height or weight on file to calculate BMI. General Appearance:  Alert, cooperative, no distress, appears stated age  Head:  Normocephalic, without obvious abnormality, atraumatic  Eyes:  Conjunctiva clear, EOM's intact  Nose: Nares normal, hypertrophic turbinates, normal mucosa, no visible anterior polyps, and septum midline  Throat: Lips, tongue normal; teeth and gums normal, normal posterior oropharynx  Neck: Supple, symmetrical  Lungs:   clear to auscultation bilaterally, Respirations unlabored, no coughing  Heart:  regular rate and rhythm and no murmur, Appears well perfused  Extremities: No edema  Skin: Skin color, texture, turgor normal, no rashes or lesions on visualized portions of skin  Neurologic: No gross deficits   Previous notes and tests were reviewed. The plan was reviewed with the patient/family, and all questions/concerned were addressed.  It was my pleasure to see Dillon today and participate in her care. Please feel free to contact me with any questions or concerns.  Sincerely,  Ferol Luz, MD  Allergy & Immunology  [facility] Office: [phone]

## 2023-05-27 NOTE — Patient Instructions (Signed)
Chronic Rhinitis Seasonal and Perennial Allergic:  - Control: Well controlled  - Prevention:  -  Continue allergen avoidance   - Symptom control: - Continue Nasal Steroid Spray: Best results if used daily. - Options include Flonase (fluticasone), Nasocort (triamcinolone), Nasonex (mometasome) 1- 2 sprays in each nostril daily.  - All can be purchased over-the-counter if not covered by insurance. - Continue Astelin (azelastine) 1-2 sprays in each nostril twice a day as needed for nasal congestion/itchy nose - Continue Singulair (Montelukast) 10mg  nightly.   - Discontinue if nightmares of behavior changes. - Continue Antihistamine: daily or daily as needed.   -Options include Zyrtec (Cetirizine) 10mg , Claritin (Loratadine) 10mg , Allegra (Fexofenadine) 180mg , or Xyzal (Levocetirinze) 5mg  - Can be purchased over-the-counter if not covered by insurance.  - AIT PLAN:  Continue allergy injections per procotol and carry epipen on injection day   Allergic Conjunctivitis:  - Consider Allergy Eye drops-great options include Pataday (Olopatadine) or Zaditor (ketotifen) for eye symptoms daily as needed-both sold over the counter if not covered by insurance. and Rewetting Drops such as Systane,TheraTears, etc  -Avoid eye drops that say red eye relief as they may contain medications that dry out your eyes.   Follow up:  6 months   Thank you so much for letting me partake in your care today.  Don't hesitate to reach out if you have any additional concerns!  Ferol Luz, MD  [facility] [place], [place]

## 2023-06-02 ENCOUNTER — Ambulatory Visit (INDEPENDENT_AMBULATORY_CARE_PROVIDER_SITE_OTHER): Payer: BC Managed Care – PPO | Admitting: *Deleted

## 2023-06-02 DIAGNOSIS — J309 Allergic rhinitis, unspecified: Secondary | ICD-10-CM | POA: Diagnosis not present

## 2023-06-07 ENCOUNTER — Other Ambulatory Visit: Payer: Self-pay | Admitting: Internal Medicine

## 2023-06-09 ENCOUNTER — Ambulatory Visit (INDEPENDENT_AMBULATORY_CARE_PROVIDER_SITE_OTHER): Payer: BC Managed Care – PPO

## 2023-06-09 DIAGNOSIS — J309 Allergic rhinitis, unspecified: Secondary | ICD-10-CM | POA: Diagnosis not present

## 2023-06-15 ENCOUNTER — Ambulatory Visit (INDEPENDENT_AMBULATORY_CARE_PROVIDER_SITE_OTHER): Payer: Self-pay | Admitting: *Deleted

## 2023-06-15 DIAGNOSIS — J309 Allergic rhinitis, unspecified: Secondary | ICD-10-CM | POA: Diagnosis not present

## 2023-06-23 ENCOUNTER — Ambulatory Visit (INDEPENDENT_AMBULATORY_CARE_PROVIDER_SITE_OTHER): Payer: BC Managed Care – PPO

## 2023-06-23 DIAGNOSIS — J309 Allergic rhinitis, unspecified: Secondary | ICD-10-CM | POA: Diagnosis not present

## 2023-07-07 ENCOUNTER — Ambulatory Visit (INDEPENDENT_AMBULATORY_CARE_PROVIDER_SITE_OTHER): Payer: BC Managed Care – PPO

## 2023-07-07 DIAGNOSIS — J309 Allergic rhinitis, unspecified: Secondary | ICD-10-CM | POA: Diagnosis not present

## 2023-07-13 ENCOUNTER — Ambulatory Visit (INDEPENDENT_AMBULATORY_CARE_PROVIDER_SITE_OTHER): Payer: BC Managed Care – PPO

## 2023-07-13 DIAGNOSIS — J309 Allergic rhinitis, unspecified: Secondary | ICD-10-CM

## 2023-07-20 ENCOUNTER — Ambulatory Visit (INDEPENDENT_AMBULATORY_CARE_PROVIDER_SITE_OTHER): Payer: BC Managed Care – PPO | Admitting: *Deleted

## 2023-07-20 DIAGNOSIS — J309 Allergic rhinitis, unspecified: Secondary | ICD-10-CM | POA: Diagnosis not present

## 2023-07-26 ENCOUNTER — Ambulatory Visit (INDEPENDENT_AMBULATORY_CARE_PROVIDER_SITE_OTHER): Payer: Self-pay | Admitting: *Deleted

## 2023-07-26 DIAGNOSIS — J309 Allergic rhinitis, unspecified: Secondary | ICD-10-CM | POA: Diagnosis not present

## 2023-07-26 NOTE — Progress Notes (Signed)
VIAL EXP 07-25-24

## 2023-07-27 DIAGNOSIS — J3081 Allergic rhinitis due to animal (cat) (dog) hair and dander: Secondary | ICD-10-CM | POA: Diagnosis not present

## 2023-08-17 ENCOUNTER — Ambulatory Visit (INDEPENDENT_AMBULATORY_CARE_PROVIDER_SITE_OTHER): Payer: BC Managed Care – PPO | Admitting: *Deleted

## 2023-08-17 DIAGNOSIS — J309 Allergic rhinitis, unspecified: Secondary | ICD-10-CM

## 2023-08-25 ENCOUNTER — Ambulatory Visit (INDEPENDENT_AMBULATORY_CARE_PROVIDER_SITE_OTHER): Payer: BC Managed Care – PPO | Admitting: *Deleted

## 2023-08-25 DIAGNOSIS — J309 Allergic rhinitis, unspecified: Secondary | ICD-10-CM

## 2023-09-08 ENCOUNTER — Ambulatory Visit (INDEPENDENT_AMBULATORY_CARE_PROVIDER_SITE_OTHER): Payer: BC Managed Care – PPO | Admitting: *Deleted

## 2023-09-08 DIAGNOSIS — J309 Allergic rhinitis, unspecified: Secondary | ICD-10-CM | POA: Diagnosis not present

## 2023-10-11 ENCOUNTER — Ambulatory Visit (INDEPENDENT_AMBULATORY_CARE_PROVIDER_SITE_OTHER): Payer: BC Managed Care – PPO

## 2023-10-11 DIAGNOSIS — J309 Allergic rhinitis, unspecified: Secondary | ICD-10-CM

## 2023-10-18 ENCOUNTER — Ambulatory Visit (INDEPENDENT_AMBULATORY_CARE_PROVIDER_SITE_OTHER): Payer: BC Managed Care – PPO

## 2023-10-18 DIAGNOSIS — J309 Allergic rhinitis, unspecified: Secondary | ICD-10-CM

## 2023-10-27 ENCOUNTER — Ambulatory Visit (INDEPENDENT_AMBULATORY_CARE_PROVIDER_SITE_OTHER): Payer: BC Managed Care – PPO | Admitting: *Deleted

## 2023-10-27 DIAGNOSIS — J309 Allergic rhinitis, unspecified: Secondary | ICD-10-CM | POA: Diagnosis not present

## 2023-11-22 ENCOUNTER — Ambulatory Visit (INDEPENDENT_AMBULATORY_CARE_PROVIDER_SITE_OTHER): Payer: Self-pay | Admitting: *Deleted

## 2023-11-22 DIAGNOSIS — J309 Allergic rhinitis, unspecified: Secondary | ICD-10-CM | POA: Diagnosis not present

## 2023-12-02 ENCOUNTER — Other Ambulatory Visit: Payer: Self-pay | Admitting: Internal Medicine

## 2023-12-20 ENCOUNTER — Ambulatory Visit (INDEPENDENT_AMBULATORY_CARE_PROVIDER_SITE_OTHER): Payer: Self-pay

## 2023-12-20 DIAGNOSIS — J309 Allergic rhinitis, unspecified: Secondary | ICD-10-CM | POA: Diagnosis not present

## 2024-01-17 ENCOUNTER — Ambulatory Visit (INDEPENDENT_AMBULATORY_CARE_PROVIDER_SITE_OTHER): Payer: Self-pay

## 2024-01-17 DIAGNOSIS — J309 Allergic rhinitis, unspecified: Secondary | ICD-10-CM | POA: Diagnosis not present

## 2024-01-21 ENCOUNTER — Other Ambulatory Visit: Payer: Self-pay | Admitting: Internal Medicine

## 2024-02-14 ENCOUNTER — Ambulatory Visit (INDEPENDENT_AMBULATORY_CARE_PROVIDER_SITE_OTHER): Payer: Self-pay

## 2024-02-14 DIAGNOSIS — J309 Allergic rhinitis, unspecified: Secondary | ICD-10-CM

## 2024-03-22 ENCOUNTER — Ambulatory Visit (INDEPENDENT_AMBULATORY_CARE_PROVIDER_SITE_OTHER): Payer: Self-pay | Admitting: *Deleted

## 2024-03-22 DIAGNOSIS — J309 Allergic rhinitis, unspecified: Secondary | ICD-10-CM

## 2024-04-18 DIAGNOSIS — J3089 Other allergic rhinitis: Secondary | ICD-10-CM | POA: Diagnosis not present

## 2024-04-18 NOTE — Progress Notes (Signed)
 VIAL MADE 04-18-24

## 2024-04-24 ENCOUNTER — Ambulatory Visit (INDEPENDENT_AMBULATORY_CARE_PROVIDER_SITE_OTHER): Payer: Self-pay

## 2024-04-24 DIAGNOSIS — J309 Allergic rhinitis, unspecified: Secondary | ICD-10-CM | POA: Diagnosis not present

## 2024-05-29 ENCOUNTER — Ambulatory Visit (INDEPENDENT_AMBULATORY_CARE_PROVIDER_SITE_OTHER): Payer: Self-pay

## 2024-05-29 DIAGNOSIS — J309 Allergic rhinitis, unspecified: Secondary | ICD-10-CM | POA: Diagnosis not present

## 2024-07-03 ENCOUNTER — Ambulatory Visit (INDEPENDENT_AMBULATORY_CARE_PROVIDER_SITE_OTHER)

## 2024-07-03 DIAGNOSIS — J309 Allergic rhinitis, unspecified: Secondary | ICD-10-CM | POA: Diagnosis not present

## 2024-08-02 ENCOUNTER — Ambulatory Visit: Payer: Self-pay | Admitting: *Deleted

## 2024-08-02 DIAGNOSIS — J309 Allergic rhinitis, unspecified: Secondary | ICD-10-CM

## 2024-08-09 ENCOUNTER — Ambulatory Visit (INDEPENDENT_AMBULATORY_CARE_PROVIDER_SITE_OTHER): Admitting: *Deleted

## 2024-08-09 DIAGNOSIS — J309 Allergic rhinitis, unspecified: Secondary | ICD-10-CM

## 2024-08-28 ENCOUNTER — Ambulatory Visit: Payer: Self-pay | Admitting: *Deleted

## 2024-08-28 DIAGNOSIS — J309 Allergic rhinitis, unspecified: Secondary | ICD-10-CM

## 2024-09-12 ENCOUNTER — Other Ambulatory Visit: Payer: Self-pay

## 2024-09-13 ENCOUNTER — Ambulatory Visit: Attending: Cardiology | Admitting: Cardiology

## 2024-09-13 ENCOUNTER — Ambulatory Visit (INDEPENDENT_AMBULATORY_CARE_PROVIDER_SITE_OTHER): Admitting: *Deleted

## 2024-09-13 ENCOUNTER — Encounter: Payer: Self-pay | Admitting: Cardiology

## 2024-09-13 VITALS — BP 118/80 | HR 83 | Ht 66.5 in | Wt 222.2 lb

## 2024-09-13 DIAGNOSIS — R002 Palpitations: Secondary | ICD-10-CM | POA: Insufficient documentation

## 2024-09-13 DIAGNOSIS — R000 Tachycardia, unspecified: Secondary | ICD-10-CM

## 2024-09-13 DIAGNOSIS — J309 Allergic rhinitis, unspecified: Secondary | ICD-10-CM

## 2024-09-13 DIAGNOSIS — E669 Obesity, unspecified: Secondary | ICD-10-CM | POA: Insufficient documentation

## 2024-09-13 DIAGNOSIS — R011 Cardiac murmur, unspecified: Secondary | ICD-10-CM | POA: Diagnosis not present

## 2024-09-13 NOTE — Progress Notes (Signed)
 Cardiology Office Note:    Date:  09/13/2024   ID:  Alexandra Ortega, DOB 10/01/1981, MRN [id number]  PCP:  Dorene Perkins, NP  Cardiologist:  Jennifer JONELLE Crape, MD   Referring MD: Dorene Perkins, NP    ASSESSMENT:    1. Palpitations   2. Obesity (BMI 35.0-39.9 without comorbidity)   3. Cardiac murmur    PLAN:    In order of problems listed above:  Primary prevention stressed with the patient.  Importance of compliance with diet medication stressed and patient verbalized standing. She was advised to walk at least half an hour a day on a daily basis. Palpitations: These are mostly resolved or benign.  I discussed event monitor report with her.  She has brief atrial runs.  I worry that if she gets beta-blockers her blood pressure is going to be lower and she will not be elevated.  She is not keen on any medications either.  She will diet she will exercise she will cut down caffeinated beverages from her diet. Obesity: Weight reduction stressed diet emphasized and she promises to do better. Cardiac murmur: Echocardiogram will be done to assess murmur heard on auscultation. Patient will be seen in follow-up appointment in 6 months or earlier if the patient has any concerns.    Medication Adjustments/Labs and Tests Ordered: Current medicines are reviewed at length with the patient today.  Concerns regarding medicines are outlined above.  Orders Placed This Encounter  Procedures   EKG 12-Lead   No orders of the defined types were placed in this encounter.    History of Present Illness:    Alexandra Ortega is a 43 y.o. female who is being seen today for the evaluation of palpitations at the request of Dorene Perkins, NP.  Patient is a pleasant 43 year old female.  She has no significant past medical history.  She underwent event monitoring with atrial events which were brief.  She denies any significant palpitations.  No chest pain orthopnea or PND.  She leads a sedentary lifestyle.   At the time of my evaluation, the patient is alert awake oriented and in no distress.  Past Medical History:  Diagnosis Date   Palpitations    Rhinitis medicamentosa 12/07/2022   Tachycardia     Past Surgical History:  Procedure Laterality Date   HERNIA REPAIR  2022   TUBAL LIGATION  2002   uterine ablation  2022    Current Medications: Active Medications[1]   Allergies:   Cat dander   Social History   Socioeconomic History   Marital status: Married    Spouse name: Not on file   Number of children: Not on file   Years of education: Not on file   Highest education level: Not on file  Occupational History   Not on file  Tobacco Use   Smoking status: Every Day    Types: Cigarettes    Passive exposure: Current   Smokeless tobacco: Not on file  Vaping Use   Vaping status: Never Used  Substance and Sexual Activity   Alcohol use: Yes    Comment: 2-3x weekly   Drug use: Never   Sexual activity: Yes  Other Topics Concern   Not on file  Social History Narrative   Not on file   Social Drivers of Health   Tobacco Use: High Risk (09/13/2024)   Patient History    Smoking Tobacco Use: Every Day    Smokeless Tobacco Use: Unknown    Passive Exposure: Current  Financial Resource Strain: Not on file  Food Insecurity: Not on file  Transportation Needs: Not on file  Physical Activity: Not on file  Stress: Not on file  Social Connections: Not on file  Depression (EYV7-0): Not on file  Alcohol Screen: Not on file  Housing: Not on file  Utilities: Not on file  Health Literacy: Not on file     Family History: The patient's family history includes Allergic rhinitis in her mother; Eczema in her son.  ROS:   Please see the history of present illness.    All other systems reviewed and are negative.  EKGs/Labs/Other Studies Reviewed:    The following studies were reviewed today:  EKG Interpretation Date/Time:  Thursday September 13 2024 09:46:31 EST Ventricular Rate:   83 PR Interval:  130 QRS Duration:  86 QT Interval:  368 QTC Calculation: 432 R Axis:   68  Text Interpretation: Normal sinus rhythm Normal ECG No previous ECGs available Confirmed by Edwyna Backers [phone]) on 09/13/2024 10:09:01 AM     Recent Labs: No results found for requested labs within last 365 days.  Recent Lipid Panel No results found for: CHOL, TRIG, HDL, CHOLHDL, VLDL, LDLCALC, LDLDIRECT  Physical Exam:    VS:  BP 118/80   Pulse 83   Ht 5' 6.5 (1.689 m)   Wt 222 lb 4 oz (100.8 kg)   SpO2 96%   BMI 35.33 kg/m     Wt Readings from Last 3 Encounters:  09/13/24 222 lb 4 oz (100.8 kg)  12/06/22 226 lb 3.2 oz (102.6 kg)     GEN: Patient is in no acute distress HEENT: Normal NECK: No JVD; No carotid bruits LYMPHATICS: No lymphadenopathy CARDIAC: S1 S2 regular, 2/6 systolic murmur at the apex. RESPIRATORY:  Clear to auscultation without rales, wheezing or rhonchi  ABDOMEN: Soft, non-tender, non-distended MUSCULOSKELETAL:  No edema; No deformity  SKIN: Warm and dry NEUROLOGIC:  Alert and oriented x 3 PSYCHIATRIC:  Normal affect    Signed, Backers JONELLE Edwyna, MD  09/13/2024 10:18 AM    [facility]      [1]  Current Meds  Medication Sig   ALPRAZolam (XANAX) 0.5 MG tablet Take 0.5 mg by mouth daily.   Cholecalciferol 50 MCG (2000 UT) TABS Take 2,000 Units by mouth daily.   EPINEPHrine  0.3 mg/0.3 mL IJ SOAJ injection Inject 0.3 mg into the muscle as needed for anaphylaxis.   loratadine (CLARITIN) 10 MG tablet Take 10 mg by mouth daily.   Multiple Vitamins-Minerals (MULTI FOR HER) TABS Take 1 tablet by mouth daily.   traZODone (DESYREL) 100 MG tablet Take 100 mg by mouth at bedtime.   venlafaxine XR (EFFEXOR-XR) 75 MG 24 hr capsule Take 75 mg by mouth daily.

## 2024-09-13 NOTE — Patient Instructions (Signed)
 Medication Instructions:  Your physician recommends that you continue on your current medications as directed. Please refer to the Current Medication list given to you today.  *If you need a refill on your cardiac medications before your next appointment, please call your pharmacy*   Lab Work: None ordered If you have labs (blood work) drawn today and your tests are completely normal, you will receive your results only by: MyChart Message (if you have MyChart) OR A paper copy in the mail If you have any lab test that is abnormal or we need to change your treatment, we will call you to review the results.  Testing/Procedures: Your physician has requested that you have an echocardiogram. Echocardiography is a painless test that uses sound waves to create images of your heart. It provides your doctor with information about the size and shape of your heart and how well your hearts chambers and valves are working. This procedure takes approximately one hour. There are no restrictions for this procedure. Please do NOT wear cologne, perfume, aftershave, or lotions (deodorant is allowed). Please arrive 15 minutes prior to your appointment time.  Please note: We ask at that you not bring children with you during ultrasound (echo/ vascular) testing. Due to room size and safety concerns, children are not allowed in the ultrasound rooms during exams. Our front office staff cannot provide observation of children in our lobby area while testing is being conducted. An adult accompanying a patient to their appointment will only be allowed in the ultrasound room at the discretion of the ultrasound technician under special circumstances. We apologize for any inconvenience.  Follow-Up: At [facility], you and your health needs are our priority.  As part of our continuing mission to provide you with exceptional heart care, we have created designated Provider Care Teams.  These Care Teams include your primary  Cardiologist (physician) and Advanced Practice Providers (APPs -  Physician Assistants and Nurse Practitioners) who all work together to provide you with the care you need, when you need it.  We recommend signing up for the patient portal called MyChart.  Sign up information is provided on this After Visit Summary.  MyChart is used to connect with patients for Virtual Visits (Telemedicine).  Patients are able to view lab/test results, encounter notes, upcoming appointments, etc.  Non-urgent messages can be sent to your provider as well.   To learn more about what you can do with MyChart, go to [email or web address].    Your next appointment:   3 month(s)  The format for your next appointment:   In Person  Provider:   Jennifer Crape, MD   Other Instructions Echocardiogram An echocardiogram is a test that uses sound waves (ultrasound) to produce images of the heart. Images from an echocardiogram can provide important information about: Heart size and shape. The size and thickness and movement of your heart's walls. Heart muscle function and strength. Heart valve function or if you have stenosis. Stenosis is when the heart valves are too narrow. If blood is flowing backward through the heart valves (regurgitation). A tumor or infectious growth around the heart valves. Areas of heart muscle that are not working well because of poor blood flow or injury from a heart attack. Aneurysm detection. An aneurysm is a weak or damaged part of an artery wall. The wall bulges out from the normal force of blood pumping through the body. Tell a health care provider about: Any allergies you have. All medicines you are taking, including vitamins, herbs,  eye drops, creams, and over-the-counter medicines. Any blood disorders you have. Any surgeries you have had. Any medical conditions you have. Whether you are pregnant or may be pregnant. What are the risks? Generally, this is a safe test. However,  problems may occur, including an allergic reaction to dye (contrast) that may be used during the test. What happens before the test? No specific preparation is needed. You may eat and drink normally. What happens during the test? You will take off your clothes from the waist up and put on a hospital gown. Electrodes or electrocardiogram (ECG)patches may be placed on your chest. The electrodes or patches are then connected to a device that monitors your heart rate and rhythm. You will lie down on a table for an ultrasound exam. A gel will be applied to your chest to help sound waves pass through your skin. A handheld device, called a transducer, will be pressed against your chest and moved over your heart. The transducer produces sound waves that travel to your heart and bounce back (or echo back) to the transducer. These sound waves will be captured in real-time and changed into images of your heart that can be viewed on a video monitor. The images will be recorded on a computer and reviewed by your health care provider. You may be asked to change positions or hold your breath for a short time. This makes it easier to get different views or better views of your heart. In some cases, you may receive contrast through an IV in one of your veins. This can improve the quality of the pictures from your heart. The procedure may vary among health care providers and hospitals.   What can I expect after the test? You may return to your normal, everyday life, including diet, activities, and medicines, unless your health care provider tells you not to do that. Follow these instructions at home: It is up to you to get the results of your test. Ask your health care provider, or the department that is doing the test, when your results will be ready. Keep all follow-up visits. This is important. Summary An echocardiogram is a test that uses sound waves (ultrasound) to produce images of the heart. Images from an  echocardiogram can provide important information about the size and shape of your heart, heart muscle function, heart valve function, and other possible heart problems. You do not need to do anything to prepare before this test. You may eat and drink normally. After the echocardiogram is completed, you may return to your normal, everyday life, unless your health care provider tells you not to do that. This information is not intended to replace advice given to you by your health care provider. Make sure you discuss any questions you have with your health care provider. Document Revised: 05/13/2020 Document Reviewed: 05/13/2020 Elsevier Patient Education  2021 [employer]   Important Information About Sugar

## 2024-10-11 ENCOUNTER — Ambulatory Visit (INDEPENDENT_AMBULATORY_CARE_PROVIDER_SITE_OTHER): Admitting: *Deleted

## 2024-10-11 DIAGNOSIS — J302 Other seasonal allergic rhinitis: Secondary | ICD-10-CM

## 2024-10-12 ENCOUNTER — Ambulatory Visit: Payer: Self-pay | Admitting: Cardiology

## 2024-10-12 ENCOUNTER — Ambulatory Visit: Attending: Cardiology

## 2024-10-12 DIAGNOSIS — R011 Cardiac murmur, unspecified: Secondary | ICD-10-CM

## 2024-10-12 DIAGNOSIS — R000 Tachycardia, unspecified: Secondary | ICD-10-CM

## 2024-10-12 LAB — ECHOCARDIOGRAM COMPLETE
Area-P 1/2: 3.92 cm2
S' Lateral: 3.1 cm

## 2024-11-08 ENCOUNTER — Ambulatory Visit: Admitting: *Deleted

## 2024-11-08 DIAGNOSIS — J302 Other seasonal allergic rhinitis: Secondary | ICD-10-CM

## 2024-12-12 ENCOUNTER — Ambulatory Visit: Admitting: Cardiology
